# Patient Record
Sex: Male | Born: 1950 | Race: White | Hispanic: No | Marital: Married | Smoking: Never smoker
Health system: Southern US, Community
[De-identification: ages and names within clinical notes are randomized; demographics above are authoritative.]

## PROBLEM LIST (undated history)

## (undated) DIAGNOSIS — I4891 Unspecified atrial fibrillation: Secondary | ICD-10-CM

## (undated) DIAGNOSIS — K219 Gastro-esophageal reflux disease without esophagitis: Secondary | ICD-10-CM

## (undated) DIAGNOSIS — I341 Nonrheumatic mitral (valve) prolapse: Secondary | ICD-10-CM

## (undated) HISTORY — DX: Nonrheumatic mitral (valve) prolapse: I34.1

## (undated) HISTORY — DX: Gastro-esophageal reflux disease without esophagitis: K21.9

## (undated) HISTORY — DX: Unspecified atrial fibrillation: I48.91

---

## 2013-10-31 ENCOUNTER — Ambulatory Visit (INDEPENDENT_AMBULATORY_CARE_PROVIDER_SITE_OTHER): Payer: BC Managed Care – PPO | Admitting: *Deleted

## 2013-10-31 DIAGNOSIS — Z5181 Encounter for therapeutic drug level monitoring: Secondary | ICD-10-CM | POA: Insufficient documentation

## 2013-10-31 DIAGNOSIS — I4891 Unspecified atrial fibrillation: Secondary | ICD-10-CM

## 2013-10-31 LAB — POCT INR: INR: 3

## 2013-11-14 ENCOUNTER — Ambulatory Visit (INDEPENDENT_AMBULATORY_CARE_PROVIDER_SITE_OTHER): Payer: BC Managed Care – PPO | Admitting: Cardiovascular Disease

## 2013-11-14 VITALS — BP 117/81 | HR 79 | Ht 76.0 in | Wt 200.0 lb

## 2013-11-14 DIAGNOSIS — Z5181 Encounter for therapeutic drug level monitoring: Secondary | ICD-10-CM

## 2013-11-14 DIAGNOSIS — I4891 Unspecified atrial fibrillation: Secondary | ICD-10-CM

## 2013-11-14 DIAGNOSIS — Z136 Encounter for screening for cardiovascular disorders: Secondary | ICD-10-CM

## 2013-11-14 MED ORDER — WARFARIN SODIUM 5 MG PO TABS: 10.0000 mg | ORAL_TABLET | ORAL | Status: DC

## 2013-11-14 MED ORDER — METOPROLOL SUCCINATE ER 50 MG PO TB24: 50.0000 mg | ORAL_TABLET | Freq: Every day | ORAL | Status: DC

## 2013-11-14 MED ORDER — VERAPAMIL HCL 120 MG PO TABS: 120.0000 mg | ORAL_TABLET | Freq: Three times a day (TID) | ORAL | Status: DC

## 2013-11-14 MED ORDER — ALPRAZOLAM 0.25 MG PO TABS: 0.2500 mg | ORAL_TABLET | Freq: Four times a day (QID) | ORAL | Status: DC | PRN

## 2013-11-14 NOTE — Patient Instructions (Signed)
Continue all current medications. Your physician wants you to follow up in: 6 months.  You will receive a reminder letter in the mail one-two months in advance.  If you don't receive a letter, please call our office to schedule the follow up appointment   

## 2013-11-14 NOTE — Progress Notes (Signed)
Patient ID: Christopher Blanchard, male   DOB: Jun 21, 1951, 63 y.o.   MRN: [id number]       CARDIOLOGY CONSULT NOTE  Patient ID: Christopher Blanchard MRN: [id number] DOB/AGE: 30-Jul-1951 63 y.o.  Admit date: (Not on file) Primary Physician No primary provider on file.  Reason for Consultation: atrial fibrillation  HPI: The patient is a 63 year old male who recently moved here from [place] in 06/2013. He is a retired Personnel officer. He and his wife moved here to be with their daughter and grandchildren. He has a history of atrial fibrillation. He takes warfarin for anticoagulation and takes metoprolol and verapamil for rate control. His 12-lead ECG today shows atrial fibrillation, heart rate 64 beats per minute. He thinks he was diagnosed approximately 5 years ago. He denies a history of myocardial infarction.The patient denies any symptoms of chest pain, shortness of breath, lightheadedness, dizziness, leg swelling, orthopnea, PND, and syncope. He has a history of anxiety and rarely takes Xanax for this. Although he seldom experiences palpitations, he does become anxious when this occurs.     Allergies not on file  Current Outpatient Prescriptions  Medication Sig Dispense Refill  . ALPRAZolam (XANAX) 0.5 MG tablet Take 0.25 mg by mouth every 6 (six) hours as needed for anxiety.      . cephALEXin (KEFLEX) 500 MG capsule Take 2,000 mg by mouth as needed. Prior to dental  visits      . metoprolol succinate (TOPROL-XL) 50 MG 24 hr tablet Take 50 mg by mouth daily. Take with or immediately following a meal.      . verapamil (CALAN) 120 MG tablet Take 120 mg by mouth 3 (three) times daily.      Marland Kitchen warfarin (COUMADIN) 5 MG tablet Take 10 mg by mouth as directed.       No current facility-administered medications for this visit.    No past medical history on file.  No past surgical history on file.  History   Social History  . Marital Status: Married    Spouse Name: N/A    Number of Children: N/A    . Years of Education: N/A   Occupational History  . Not on file.   Social History Main Topics  . Smoking status: Not on file  . Smokeless tobacco: Not on file  . Alcohol Use: Not on file  . Drug Use: Not on file  . Sexual Activity: Not on file   Other Topics Concern  . Not on file   Social History Narrative  . No narrative on file     No family history of premature CAD in 1st degree relatives.  Prior to Admission medications   Medication Sig Start Date End Date Taking? Authorizing Provider  ALPRAZolam Prudy Feeler) 0.5 MG tablet Take 0.25 mg by mouth every 6 (six) hours as needed for anxiety.   Yes Historical Provider, MD  cephALEXin (KEFLEX) 500 MG capsule Take 2,000 mg by mouth as needed. Prior to dental  visits   Yes Historical Provider, MD  metoprolol succinate (TOPROL-XL) 50 MG 24 hr tablet Take 50 mg by mouth daily. Take with or immediately following a meal.   Yes Historical Provider, MD  verapamil (CALAN) 120 MG tablet Take 120 mg by mouth 3 (three) times daily.   Yes Historical Provider, MD  warfarin (COUMADIN) 5 MG tablet Take 10 mg by mouth as directed.   Yes Historical Provider, MD     Review of systems complete and found to be negative unless listed  above in HPI     Physical exam Blood pressure 117/81, pulse 79, height 6\' 4"  (1.93 m), weight 200 lb (90.719 kg). General: NAD Neck: No JVD, no thyromegaly or thyroid nodule.  Lungs: Clear to auscultation bilaterally with normal respiratory effort. CV: Nondisplaced PMI.  Heart irregular rhythm, normal S1/S2, no S3, no murmur.  No peripheral edema.  No carotid bruit.  Normal pedal pulses.  Abdomen: Soft, nontender, no hepatosplenomegaly, no distention.  Skin: Intact without lesions or rashes.  Neurologic: Alert and oriented x 3.  Psych: Normal affect. Extremities: No clubbing or cyanosis.  HEENT: Normal.   Labs:   No results found for this basename: WBC, HGB, HCT, MCV, PLT   No results found for this basename:  NA, K, CL, CO2, BUN, CREATININE, CALCIUM, LABALBU, PROT, BILITOT, ALKPHOS, ALT, AST, GLUCOSE,  in the last 168 hours No results found for this basename: CKTOTAL, CKMB, CKMBINDEX, TROPONINI    No results found for this basename: CHOL   No results found for this basename: HDL   No results found for this basename: LDLCALC   No results found for this basename: TRIG   No results found for this basename: CHOLHDL   No results found for this basename: LDLDIRECT         Studies: No results found.  ASSESSMENT AND PLAN: 1. Atrial fibrillation: His heart rate is controlled on both metoprolol and verapamil. He is asymptomatic. He takes warfarin for anticoagulation and is enrolled in our clinic. I did offer him one of the new target specific oral anticoagulants, but he is not interested at this time. 2. Anxiety: Although he seldom experiences palpitations, he does become anxious when this occurs. I will give him a 30 day supply of Xanax of 0.25 mg tablets to be used as needed for anxiety with one refill.  Dispo: f/u 6 months.  Signed: Prentice DockerSuresh Koneswaran, M.D., F.A.C.C.  11/14/2013, 9:32 AM

## 2013-11-28 ENCOUNTER — Ambulatory Visit (INDEPENDENT_AMBULATORY_CARE_PROVIDER_SITE_OTHER): Payer: BC Managed Care – PPO | Admitting: *Deleted

## 2013-11-28 DIAGNOSIS — I4891 Unspecified atrial fibrillation: Secondary | ICD-10-CM

## 2013-11-28 DIAGNOSIS — Z5181 Encounter for therapeutic drug level monitoring: Secondary | ICD-10-CM

## 2013-11-28 LAB — POCT INR: INR: 3.3

## 2013-12-26 ENCOUNTER — Ambulatory Visit (INDEPENDENT_AMBULATORY_CARE_PROVIDER_SITE_OTHER): Payer: BC Managed Care – PPO | Admitting: *Deleted

## 2013-12-26 DIAGNOSIS — Z5181 Encounter for therapeutic drug level monitoring: Secondary | ICD-10-CM

## 2013-12-26 DIAGNOSIS — I4891 Unspecified atrial fibrillation: Secondary | ICD-10-CM

## 2013-12-26 LAB — POCT INR: INR: 3.4

## 2014-01-23 ENCOUNTER — Ambulatory Visit (INDEPENDENT_AMBULATORY_CARE_PROVIDER_SITE_OTHER): Payer: BC Managed Care – PPO | Admitting: *Deleted

## 2014-01-23 DIAGNOSIS — Z5181 Encounter for therapeutic drug level monitoring: Secondary | ICD-10-CM

## 2014-01-23 DIAGNOSIS — I4891 Unspecified atrial fibrillation: Secondary | ICD-10-CM

## 2014-01-23 LAB — POCT INR: INR: 2.7

## 2014-02-20 ENCOUNTER — Ambulatory Visit (INDEPENDENT_AMBULATORY_CARE_PROVIDER_SITE_OTHER): Payer: BC Managed Care – PPO | Admitting: *Deleted

## 2014-02-20 DIAGNOSIS — I4891 Unspecified atrial fibrillation: Secondary | ICD-10-CM

## 2014-02-20 DIAGNOSIS — Z5181 Encounter for therapeutic drug level monitoring: Secondary | ICD-10-CM

## 2014-02-20 LAB — POCT INR: INR: 3

## 2014-03-20 ENCOUNTER — Ambulatory Visit (INDEPENDENT_AMBULATORY_CARE_PROVIDER_SITE_OTHER): Payer: BC Managed Care – PPO | Admitting: *Deleted

## 2014-03-20 DIAGNOSIS — Z5181 Encounter for therapeutic drug level monitoring: Secondary | ICD-10-CM

## 2014-03-20 DIAGNOSIS — I4891 Unspecified atrial fibrillation: Secondary | ICD-10-CM

## 2014-03-20 LAB — POCT INR: INR: 2.6

## 2014-04-24 ENCOUNTER — Ambulatory Visit (INDEPENDENT_AMBULATORY_CARE_PROVIDER_SITE_OTHER): Payer: BC Managed Care – PPO | Admitting: *Deleted

## 2014-04-24 DIAGNOSIS — I4891 Unspecified atrial fibrillation: Secondary | ICD-10-CM

## 2014-04-24 DIAGNOSIS — Z5181 Encounter for therapeutic drug level monitoring: Secondary | ICD-10-CM

## 2014-04-24 LAB — POCT INR: INR: 2.5

## 2014-05-31 ENCOUNTER — Telehealth: Payer: Self-pay | Admitting: *Deleted

## 2014-05-31 ENCOUNTER — Other Ambulatory Visit: Payer: Self-pay | Admitting: *Deleted

## 2014-05-31 MED ORDER — WARFARIN SODIUM 5 MG PO TABS: 10.0000 mg | ORAL_TABLET | ORAL | Status: DC

## 2014-05-31 MED ORDER — METOPROLOL SUCCINATE ER 50 MG PO TB24: 50.0000 mg | ORAL_TABLET | Freq: Every day | ORAL | Status: DC

## 2014-05-31 MED ORDER — VERAPAMIL HCL 120 MG PO TABS: 120.0000 mg | ORAL_TABLET | Freq: Three times a day (TID) | ORAL | Status: DC

## 2014-05-31 NOTE — Telephone Encounter (Signed)
METOPROLOL, COUMADIN AND VERAPAMIL ALL SENT TO PHARMACY. 6 MO F/U SCHEDULED ALSO

## 2014-06-05 ENCOUNTER — Ambulatory Visit (INDEPENDENT_AMBULATORY_CARE_PROVIDER_SITE_OTHER): Payer: BC Managed Care – PPO | Admitting: Cardiovascular Disease

## 2014-06-05 ENCOUNTER — Ambulatory Visit (INDEPENDENT_AMBULATORY_CARE_PROVIDER_SITE_OTHER): Payer: BC Managed Care – PPO | Admitting: *Deleted

## 2014-06-05 ENCOUNTER — Encounter: Payer: Self-pay | Admitting: Cardiovascular Disease

## 2014-06-05 VITALS — BP 112/76 | HR 64 | Ht 76.0 in | Wt 209.0 lb

## 2014-06-05 DIAGNOSIS — Z5181 Encounter for therapeutic drug level monitoring: Secondary | ICD-10-CM

## 2014-06-05 DIAGNOSIS — I4891 Unspecified atrial fibrillation: Secondary | ICD-10-CM

## 2014-06-05 LAB — POCT INR: INR: 3

## 2014-06-05 NOTE — Patient Instructions (Signed)
Continue all current medications. Your physician wants you to follow up in:  1 year.  You will receive a reminder letter in the mail one-two months in advance.  If you don't receive a letter, please call our office to schedule the follow up appointment   

## 2014-06-05 NOTE — Progress Notes (Signed)
Patient ID: Christopher Blanchard, male   DOB: 09/12/1950, 63 y.o.   MRN: [id number]      SUBJECTIVE: The patient is here for routine followup for atrial fibrillation. He has been doing well and denies chest pain, palpitations, shortness of breath, leg swelling, and dizziness. He and his wife recently visited [place], and they have made two trips to [place] since I last saw them.   Review of Systems: As per "subjective", otherwise negative.  Allergies  Allergen Reactions  . Penicillins Palpitations    Current Outpatient Prescriptions  Medication Sig Dispense Refill  . ALPRAZolam (XANAX) 0.25 MG tablet Take 1 tablet (0.25 mg total) by mouth every 6 (six) hours as needed for anxiety.  30 tablet  1  . cephALEXin (KEFLEX) 500 MG capsule Take 2,000 mg by mouth as needed. Prior to dental  visits      . metoprolol succinate (TOPROL-XL) 50 MG 24 hr tablet Take 1 tablet (50 mg total) by mouth daily. Take with or immediately following a meal.  90 tablet  3  . verapamil (CALAN) 120 MG tablet Take 1 tablet (120 mg total) by mouth 3 (three) times daily.  270 tablet  3  . warfarin (COUMADIN) 5 MG tablet Take 2 tablets (10 mg total) by mouth as directed.  180 tablet  0   No current facility-administered medications for this visit.    No past medical history on file.  No past surgical history on file.  History   Social History  . Marital Status: Married    Spouse Name: N/A    Number of Children: N/A  . Years of Education: N/A   Occupational History  . Not on file.   Social History Main Topics  . Smoking status: Never Smoker   . Smokeless tobacco: Never Used  . Alcohol Use: Not on file  . Drug Use: Not on file  . Sexual Activity: Not on file   Other Topics Concern  . Not on file   Social History Narrative  . No narrative on file     Filed Vitals:   06/05/14 1511  Height: 6\' 4"  (1.93 m)  Weight: 209 lb (94.802 kg)   BP 112/76  Pulse 64   PHYSICAL EXAM General: NAD  Neck: No  JVD, no thyromegaly or thyroid nodule.  Lungs: Clear to auscultation bilaterally with normal respiratory effort.  CV: Nondisplaced PMI. Irregular rhythm, normal S1/S2, no S3, no murmur. No peripheral edema. No carotid bruit. Normal pedal pulses.  Abdomen: Soft, nontender, no hepatosplenomegaly, no distention.  Skin: Intact without lesions or rashes.  Neurologic: Alert and oriented x 3.  Psych: Normal affect. Skin: Normal. Musculoskeletal: Normal range of motion, no gross deformities. Extremities: No clubbing or cyanosis.   ECG: Most recent ECG reviewed.      ASSESSMENT AND PLAN: 1. Atrial fibrillation: His heart rate is controlled on both metoprolol and verapamil. He is asymptomatic. He takes warfarin for anticoagulation and is enrolled in our clinic. I previously offered him one of the new target specific oral anticoagulants, but he was not interested.  Dispo: f/u 1 year.  Prentice DockerSuresh Jennett Tarbell, M.D., F.A.C.C.

## 2014-07-17 ENCOUNTER — Ambulatory Visit (INDEPENDENT_AMBULATORY_CARE_PROVIDER_SITE_OTHER): Payer: BC Managed Care – PPO | Admitting: *Deleted

## 2014-07-17 DIAGNOSIS — Z5181 Encounter for therapeutic drug level monitoring: Secondary | ICD-10-CM

## 2014-07-17 DIAGNOSIS — I4891 Unspecified atrial fibrillation: Secondary | ICD-10-CM

## 2014-07-17 LAB — POCT INR: INR: 3.2

## 2014-08-21 ENCOUNTER — Ambulatory Visit (INDEPENDENT_AMBULATORY_CARE_PROVIDER_SITE_OTHER): Payer: Federal, State, Local not specified - PPO | Admitting: *Deleted

## 2014-08-21 DIAGNOSIS — Z5181 Encounter for therapeutic drug level monitoring: Secondary | ICD-10-CM

## 2014-08-21 DIAGNOSIS — I4891 Unspecified atrial fibrillation: Secondary | ICD-10-CM

## 2014-08-21 LAB — POCT INR: INR: 2.9

## 2014-08-22 ENCOUNTER — Telehealth: Payer: Self-pay | Admitting: *Deleted

## 2014-08-22 NOTE — Telephone Encounter (Signed)
Patient is not coming off coumadin for teeth extraction. Dentist is requesting INR 1 day prior to procedure Tuesday 08/28/14.

## 2014-08-23 ENCOUNTER — Telehealth: Payer: Self-pay | Admitting: *Deleted

## 2014-08-23 NOTE — Telephone Encounter (Signed)
Pt is having 2 teeth extracted on 1/12 by [facility] in [place].  They told him it was ok for him to stay on his coumadin as long as his INR was in the 2.0 - 3.0 range.  He is scheduled to come to the office on 08/27/14 for an INR check per surgeons request.

## 2014-08-23 NOTE — Telephone Encounter (Signed)
Mr. Lovell SheehanJenkins is having a dental procedure requesting to speak with Misty StanleyLisa. [phone]

## 2014-09-18 ENCOUNTER — Ambulatory Visit (INDEPENDENT_AMBULATORY_CARE_PROVIDER_SITE_OTHER): Payer: Federal, State, Local not specified - PPO | Admitting: *Deleted

## 2014-09-18 DIAGNOSIS — I4891 Unspecified atrial fibrillation: Secondary | ICD-10-CM

## 2014-09-18 DIAGNOSIS — Z5181 Encounter for therapeutic drug level monitoring: Secondary | ICD-10-CM

## 2014-09-18 LAB — POCT INR: INR: 2.8

## 2014-10-16 ENCOUNTER — Ambulatory Visit (INDEPENDENT_AMBULATORY_CARE_PROVIDER_SITE_OTHER): Payer: Federal, State, Local not specified - PPO | Admitting: *Deleted

## 2014-10-16 DIAGNOSIS — I4891 Unspecified atrial fibrillation: Secondary | ICD-10-CM

## 2014-10-16 DIAGNOSIS — Z5181 Encounter for therapeutic drug level monitoring: Secondary | ICD-10-CM

## 2014-10-16 LAB — POCT INR: INR: 3.1

## 2014-11-13 ENCOUNTER — Ambulatory Visit (INDEPENDENT_AMBULATORY_CARE_PROVIDER_SITE_OTHER): Payer: Federal, State, Local not specified - PPO | Admitting: *Deleted

## 2014-11-13 DIAGNOSIS — I4891 Unspecified atrial fibrillation: Secondary | ICD-10-CM

## 2014-11-13 DIAGNOSIS — Z5181 Encounter for therapeutic drug level monitoring: Secondary | ICD-10-CM

## 2014-11-13 LAB — POCT INR: INR: 2.3

## 2014-11-16 ENCOUNTER — Other Ambulatory Visit: Payer: Self-pay | Admitting: *Deleted

## 2014-11-16 MED ORDER — WARFARIN SODIUM 5 MG PO TABS: 7.5000 mg | ORAL_TABLET | Freq: Every day | ORAL | Status: DC

## 2014-12-18 ENCOUNTER — Ambulatory Visit (INDEPENDENT_AMBULATORY_CARE_PROVIDER_SITE_OTHER): Payer: BLUE CROSS/BLUE SHIELD | Admitting: *Deleted

## 2014-12-18 DIAGNOSIS — I4891 Unspecified atrial fibrillation: Secondary | ICD-10-CM

## 2014-12-18 DIAGNOSIS — Z5181 Encounter for therapeutic drug level monitoring: Secondary | ICD-10-CM

## 2014-12-18 LAB — POCT INR: INR: 2

## 2015-01-29 ENCOUNTER — Ambulatory Visit (INDEPENDENT_AMBULATORY_CARE_PROVIDER_SITE_OTHER): Payer: BLUE CROSS/BLUE SHIELD | Admitting: *Deleted

## 2015-01-29 DIAGNOSIS — Z5181 Encounter for therapeutic drug level monitoring: Secondary | ICD-10-CM

## 2015-01-29 DIAGNOSIS — I4891 Unspecified atrial fibrillation: Secondary | ICD-10-CM | POA: Diagnosis not present

## 2015-01-29 LAB — POCT INR: INR: 3

## 2015-02-20 ENCOUNTER — Other Ambulatory Visit: Payer: Self-pay | Admitting: Cardiovascular Disease

## 2015-02-20 MED ORDER — WARFARIN SODIUM 5 MG PO TABS: 7.5000 mg | ORAL_TABLET | Freq: Every day | ORAL | Status: DC

## 2015-02-20 NOTE — Telephone Encounter (Signed)
Needs refill warfarin (COUMADIN) 5 MG tablet

## 2015-02-20 NOTE — Telephone Encounter (Signed)
Done

## 2015-03-12 ENCOUNTER — Ambulatory Visit (INDEPENDENT_AMBULATORY_CARE_PROVIDER_SITE_OTHER): Payer: BLUE CROSS/BLUE SHIELD | Admitting: *Deleted

## 2015-03-12 DIAGNOSIS — Z5181 Encounter for therapeutic drug level monitoring: Secondary | ICD-10-CM

## 2015-03-12 DIAGNOSIS — I4891 Unspecified atrial fibrillation: Secondary | ICD-10-CM

## 2015-03-12 LAB — POCT INR: INR: 3

## 2015-04-05 ENCOUNTER — Ambulatory Visit (INDEPENDENT_AMBULATORY_CARE_PROVIDER_SITE_OTHER): Payer: BLUE CROSS/BLUE SHIELD | Admitting: Cardiovascular Disease

## 2015-04-05 ENCOUNTER — Encounter: Payer: Self-pay | Admitting: Cardiovascular Disease

## 2015-04-05 VITALS — BP 100/82 | HR 52 | Ht 76.0 in | Wt 209.0 lb

## 2015-04-05 DIAGNOSIS — J302 Other seasonal allergic rhinitis: Secondary | ICD-10-CM | POA: Diagnosis not present

## 2015-04-05 DIAGNOSIS — I482 Chronic atrial fibrillation, unspecified: Secondary | ICD-10-CM

## 2015-04-05 DIAGNOSIS — Z5181 Encounter for therapeutic drug level monitoring: Secondary | ICD-10-CM | POA: Diagnosis not present

## 2015-04-05 DIAGNOSIS — R5383 Other fatigue: Secondary | ICD-10-CM | POA: Diagnosis not present

## 2015-04-05 MED ORDER — VERAPAMIL HCL 120 MG PO TABS: 120.0000 mg | ORAL_TABLET | Freq: Two times a day (BID) | ORAL | Status: DC

## 2015-04-05 NOTE — Patient Instructions (Signed)
   Decrease Verapamil to twice a day   Continue all other medications.   Your physician wants you to follow up in:  1 year.  You will receive a reminder letter in the mail one-two months in advance.  If you don't receive a letter, please call our office to schedule the follow up appointment

## 2015-04-05 NOTE — Progress Notes (Signed)
Patient ID: Christopher Blanchard, male   DOB: 11-15-1950, 64 y.o.   MRN: [id number]      SUBJECTIVE: The patient is here for routine followup for chronic atrial fibrillation. He denies chest pain, palpitations, lightheadedness, dizziness, leg swelling. He has been feeling fatigued. He only sleeps 2 hours per night. He takes a half tablet of Xanax as needed. He also struggles with seasonal allergies.   Review of Systems: As per "subjective", otherwise negative.  Allergies  Allergen Reactions  . Penicillins Palpitations    Current Outpatient Prescriptions  Medication Sig Dispense Refill  . ALPRAZolam (XANAX) 0.25 MG tablet Take 1 tablet (0.25 mg total) by mouth every 6 (six) hours as needed for anxiety. 30 tablet 1  . cephALEXin (KEFLEX) 500 MG capsule Take 2,000 mg by mouth as needed. Prior to dental  visits    . metoprolol succinate (TOPROL-XL) 50 MG 24 hr tablet Take 1 tablet (50 mg total) by mouth daily. Take with or immediately following a meal. 90 tablet 3  . verapamil (CALAN) 120 MG tablet Take 1 tablet (120 mg total) by mouth 3 (three) times daily. 270 tablet 3  . warfarin (COUMADIN) 5 MG tablet Take 1.5 tablets (7.5 mg total) by mouth daily. 135 tablet 0   No current facility-administered medications for this visit.    Past Medical History  Diagnosis Date  . Mitral valve prolapse   . Atrial fibrillation     No past surgical history on file.  Social History   Social History  . Marital Status: Married    Spouse Name: N/A  . Number of Children: N/A  . Years of Education: N/A   Occupational History  . Not on file.   Social History Main Topics  . Smoking status: Never Smoker   . Smokeless tobacco: Never Used  . Alcohol Use: Not on file  . Drug Use: Not on file  . Sexual Activity: Not on file   Other Topics Concern  . Not on file   Social History Narrative     Filed Vitals:   04/05/15 1144  BP: 100/82  Pulse: 52  Height:  (1.93 m)  Weight: 209 lb  (94.802 kg)  SpO2: 97%    PHYSICAL EXAM General: NAD HEENT: Normal. Neck: No JVD, no thyromegaly. Lungs: Clear to auscultation bilaterally with normal respiratory effort. CV: Bradycardic, irregular rhythm, normal S1/S2, no S3, no murmur. No pretibial or periankle edema.  No carotid bruit.   Abdomen: Soft, nontender, no distention.  Neurologic: Alert and oriented x 3.  Psych: Normal affect. Skin: Normal. Musculoskeletal: Normal range of motion, no gross deformities. Extremities: No clubbing or cyanosis.   ECG: Most recent ECG reviewed.      ASSESSMENT AND PLAN: 1. Fatigue in setting of chronic atrial fibrillation: His heart rate is controlled on both metoprolol and verapamil. However, he has been more fatigued lately and is bradycardic. Will reduce verapamil to 120 mg bid. He takes warfarin for anticoagulation and is enrolled in our clinic. I previously offered him one of the new target specific oral anticoagulants, but he was not interested.  2. Seasonal allergies: Currently trying Singulair without relief. Recommended he speak to PCP about potential referral to immunologist.  Dispo: f/u 1 year.   Prentice Docker, M.D., F.A.C.C.

## 2015-04-08 ENCOUNTER — Telehealth: Payer: Self-pay | Admitting: *Deleted

## 2015-04-08 MED ORDER — ALPRAZOLAM 0.5 MG PO TABS: 0.5000 mg | ORAL_TABLET | Freq: Every day | ORAL | Status: AC

## 2015-04-08 NOTE — Telephone Encounter (Signed)
Ok to refill prescription. Would advise getting additional refills from PCP afterwards.

## 2015-04-08 NOTE — Telephone Encounter (Signed)
Wife Lupita Leash) notified to pick up prescription.  Verbalized understanding of future refills from PMD.

## 2015-04-08 NOTE — Telephone Encounter (Signed)
Patient initially given Xanax March 2015 by you.  In for office visit on 04/05/2015.  He questions getting another prescription for Xanax.  Stated you told him to try doing 0.5mg  at bedtime as needed for sleep.

## 2015-04-23 ENCOUNTER — Ambulatory Visit (INDEPENDENT_AMBULATORY_CARE_PROVIDER_SITE_OTHER): Payer: BLUE CROSS/BLUE SHIELD | Admitting: *Deleted

## 2015-04-23 DIAGNOSIS — I4891 Unspecified atrial fibrillation: Secondary | ICD-10-CM

## 2015-04-23 DIAGNOSIS — Z5181 Encounter for therapeutic drug level monitoring: Secondary | ICD-10-CM | POA: Diagnosis not present

## 2015-04-23 LAB — POCT INR: INR: 3.1

## 2015-05-21 ENCOUNTER — Ambulatory Visit (INDEPENDENT_AMBULATORY_CARE_PROVIDER_SITE_OTHER): Payer: BLUE CROSS/BLUE SHIELD | Admitting: *Deleted

## 2015-05-21 DIAGNOSIS — Z5181 Encounter for therapeutic drug level monitoring: Secondary | ICD-10-CM

## 2015-05-21 DIAGNOSIS — I4891 Unspecified atrial fibrillation: Secondary | ICD-10-CM | POA: Diagnosis not present

## 2015-05-21 LAB — POCT INR: INR: 2.9

## 2015-06-18 ENCOUNTER — Ambulatory Visit (INDEPENDENT_AMBULATORY_CARE_PROVIDER_SITE_OTHER): Payer: BLUE CROSS/BLUE SHIELD | Admitting: *Deleted

## 2015-06-18 DIAGNOSIS — Z5181 Encounter for therapeutic drug level monitoring: Secondary | ICD-10-CM | POA: Diagnosis not present

## 2015-06-18 DIAGNOSIS — I4891 Unspecified atrial fibrillation: Secondary | ICD-10-CM

## 2015-06-18 LAB — POCT INR: INR: 2.4

## 2015-07-01 ENCOUNTER — Other Ambulatory Visit: Payer: Self-pay | Admitting: *Deleted

## 2015-07-01 MED ORDER — METOPROLOL SUCCINATE ER 50 MG PO TB24: 50.0000 mg | ORAL_TABLET | Freq: Every day | ORAL | Status: DC

## 2015-07-01 MED ORDER — VERAPAMIL HCL 120 MG PO TABS: 120.0000 mg | ORAL_TABLET | Freq: Two times a day (BID) | ORAL | Status: DC

## 2015-07-01 MED ORDER — WARFARIN SODIUM 5 MG PO TABS: 7.5000 mg | ORAL_TABLET | Freq: Every day | ORAL | Status: DC

## 2015-07-30 ENCOUNTER — Ambulatory Visit (INDEPENDENT_AMBULATORY_CARE_PROVIDER_SITE_OTHER): Payer: BLUE CROSS/BLUE SHIELD | Admitting: *Deleted

## 2015-07-30 DIAGNOSIS — Z5181 Encounter for therapeutic drug level monitoring: Secondary | ICD-10-CM | POA: Diagnosis not present

## 2015-07-30 DIAGNOSIS — I4891 Unspecified atrial fibrillation: Secondary | ICD-10-CM | POA: Diagnosis not present

## 2015-07-30 LAB — POCT INR: INR: 2

## 2015-09-10 ENCOUNTER — Ambulatory Visit (INDEPENDENT_AMBULATORY_CARE_PROVIDER_SITE_OTHER): Payer: BLUE CROSS/BLUE SHIELD | Admitting: *Deleted

## 2015-09-10 DIAGNOSIS — Z5181 Encounter for therapeutic drug level monitoring: Secondary | ICD-10-CM

## 2015-09-10 DIAGNOSIS — I4891 Unspecified atrial fibrillation: Secondary | ICD-10-CM | POA: Diagnosis not present

## 2015-09-10 LAB — POCT INR: INR: 1.9

## 2015-10-08 ENCOUNTER — Ambulatory Visit (INDEPENDENT_AMBULATORY_CARE_PROVIDER_SITE_OTHER): Payer: BLUE CROSS/BLUE SHIELD | Admitting: *Deleted

## 2015-10-08 DIAGNOSIS — Z5181 Encounter for therapeutic drug level monitoring: Secondary | ICD-10-CM

## 2015-10-08 DIAGNOSIS — I4891 Unspecified atrial fibrillation: Secondary | ICD-10-CM | POA: Diagnosis not present

## 2015-10-08 LAB — POCT INR: INR: 2.4

## 2015-10-09 ENCOUNTER — Other Ambulatory Visit (HOSPITAL_COMMUNITY): Payer: Self-pay | Admitting: Neurology

## 2015-10-09 ENCOUNTER — Encounter: Payer: Self-pay | Admitting: Neurology

## 2015-10-09 ENCOUNTER — Other Ambulatory Visit: Payer: BLUE CROSS/BLUE SHIELD

## 2015-10-09 ENCOUNTER — Ambulatory Visit (INDEPENDENT_AMBULATORY_CARE_PROVIDER_SITE_OTHER): Payer: BLUE CROSS/BLUE SHIELD | Admitting: Neurology

## 2015-10-09 VITALS — BP 126/80 | HR 77 | Ht 77.0 in | Wt 207.0 lb

## 2015-10-09 DIAGNOSIS — R253 Fasciculation: Secondary | ICD-10-CM

## 2015-10-09 DIAGNOSIS — R531 Weakness: Secondary | ICD-10-CM

## 2015-10-09 DIAGNOSIS — F458 Other somatoform disorders: Secondary | ICD-10-CM

## 2015-10-09 DIAGNOSIS — G231 Progressive supranuclear ophthalmoplegia [Steele-Richardson-Olszewski]: Secondary | ICD-10-CM | POA: Diagnosis not present

## 2015-10-09 DIAGNOSIS — R1319 Other dysphagia: Secondary | ICD-10-CM

## 2015-10-09 DIAGNOSIS — F482 Pseudobulbar affect: Secondary | ICD-10-CM

## 2015-10-09 DIAGNOSIS — R1310 Dysphagia, unspecified: Secondary | ICD-10-CM

## 2015-10-09 MED ORDER — CARBIDOPA-LEVODOPA 25-100 MG PO TABS: 1.0000 | ORAL_TABLET | Freq: Three times a day (TID) | ORAL | Status: AC

## 2015-10-09 NOTE — Patient Instructions (Addendum)
1.  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   2.  You may want to look on the cure PSP website 3. Your provider has requested that you have labwork completed today. Please go to [facility] ([id number]) on the second floor of this building before leaving the office today. You do not need to check in. If you are not called within 15 minutes please check with the front desk.  4. We have scheduled you at [facility] for your OPEN MRI on 10/21/15 at 12:30. Please arrive 30 minutes prior and go to [address] [facility]. If you need to change this appt please call [phone]. 5. Start Carbidopa Levodopa as follows: 1/2 tab three times a day before meals x 1 wk, then 1/2 in am & noon & 1 in evening for a week, then 1/2 in am &1 at noon &one in evening for a week, then 1 tablet three times a day before meals. 6. We have scheduled you at [facility] for your modified barium swallow on 10/29/15 at 1:00 pm. Please arrive 15 minutes prior and go to 1st floor radiology. If you need to reschedule for any reason please call [phone]. 7. We will call you appt for your EMG.

## 2015-10-09 NOTE — Progress Notes (Signed)
TARRENCE Blanchard was seen today in the movement disorders clinic for neurologic consultation at the request of [name] at [facility].  His PCP is [name], MD.  The consultation is for the evaluation of weak voice and drooling.  He is accompanied by his wife who supplements the history.  The patient states that they moved from [place] 2 years ago and he noted voice change less than a year after that.  It was attributed to carpet in the new house after they went to see the ENT.  They pulled the carpet out of the house and he thinks the voice is a little better, but when he went back last time, the ENT thought that he had features of PD.  He did have nodules on the vocal cord.     Specific Symptoms:  Tremor: Yes.  , when he shaves, he will note R hand tremor.  Never notes tremor in the lap Voice: become more hoarse and intermittently will lose voice  Sleep: trouble staying asleep  Vivid Dreams:  No.  Acting out dreams:  No. Wet Pillows: Yes.   (occasional) Postural symptoms:  Yes.   (states that he is less balanced but blames on being less active over last 2 years)  Falls?  No. Bradykinesia symptoms: slow movements, difficulty getting out of a chair and shorter stride length per wife Loss of smell:  No. Loss of taste:  No. Urinary Incontinence:  No. Difficulty Swallowing:  No. Handwriting, micrographia: Yes.   Trouble with ADL's:  No.  Trouble buttoning clothing: No. Depression:  No. per pt but yes per wife; more agitated per wife Memory changes:  No. Hallucinations:  No.  visual distortions: No. N/V:  No. Lightheaded:  No.  Syncope: No. Diplopia:  No. Dyskinesia:  No.  Neuroimaging has not previously been performed.     ALLERGIES:   Allergies  Allergen Reactions  . Penicillins Palpitations    CURRENT MEDICATIONS:  Outpatient Encounter Prescriptions as of 10/09/2015  Medication Sig  . ALPRAZolam (XANAX) 0.5 MG tablet Take 1 tablet (0.5 mg total) by mouth at  bedtime. As needed for sleep.  (FUTURE REFILLS FROM PMD)  . metoprolol succinate (TOPROL-XL) 50 MG 24 hr tablet Take 1 tablet (50 mg total) by mouth daily. Take with or immediately following a meal.  . pantoprazole (PROTONIX) 40 MG tablet Take 40 mg by mouth daily.  . verapamil (CALAN) 120 MG tablet Take 1 tablet (120 mg total) by mouth 2 (two) times daily.  Marland Kitchen warfarin (COUMADIN) 5 MG tablet Take 1.5 tablets (7.5 mg total) by mouth daily.  . cephALEXin (KEFLEX) 500 MG capsule Take 2,000 mg by mouth as needed. Reported on 10/09/2015   No facility-administered encounter medications on file as of 10/09/2015.    PAST MEDICAL HISTORY:   Past Medical History  Diagnosis Date  . Mitral valve prolapse   . Atrial fibrillation (HCC)   . GERD (gastroesophageal reflux disease)     PAST SURGICAL HISTORY:  No past surgical history on file.  SOCIAL HISTORY:   Social History   Social History  . Marital Status: Married    Spouse Name: N/A  . Number of Children: N/A  . Years of Education: N/A   Occupational History  . retired     Personnel officer   Social History Main Topics  . Smoking status: Never Smoker   . Smokeless tobacco: Never Used  . Alcohol Use: No  . Drug Use: No  . Sexual  Activity: Not on file   Other Topics Concern  . Not on file   Social History Narrative    FAMILY HISTORY:   Family Status  Relation Status Death Age  . Mother Deceased     DM, stroke  . Father Deceased     old age  . Sister Alive     DM  . Brother Alive     healthy  . Sister Alive     DM  . Child Alive     1, healthy    ROS:  A complete 10 system review of systems was obtained and was unremarkable apart from what is mentioned above.  PHYSICAL EXAMINATION:    VITALS:   Filed Vitals:   10/09/15 0951  BP: 126/80  Pulse: 77  Height: 6\' 5"  (1.956 m)  Weight: 207 lb (93.895 kg)   The patient was undressed and placed into examining shorts for the examination.  GEN:  The patient appears stated  age and is in NAD. HEENT:  Normocephalic, atraumatic.  The mucous membranes are moist. The superficial temporal arteries are without ropiness or tenderness.  No tongue fasciculations.   CV:  RRR Lungs:  CTAB Neck/HEME:  There are no carotid bruits bilaterally.  Neurological examination:  Orientation: The patient is alert and oriented x3. Fund of knowledge is appropriate.  Recent and remote memory are intact.  Attention and concentration are normal.    Able to name objects and repeat phrases. Cranial nerves: There is good facial symmetry. There is significant facial hypomimia.  Pupils are equal round and reactive to light bilaterally. Fundoscopic exam reveals clear margins bilaterally. Extraocular muscles are intact. The visual fields are full to confrontational testing. The speech is fluent and clear but significant pseudobulbar speech and pseudobulbar laughter. Unable to perform gutteral sounds.  Soft palate rises symmetrically and there is no tongue deviation. Hearing is intact to conversational tone. Sensation: Sensation is intact to light and pinprick throughout (facial, trunk, extremities). Vibration is intact at the bilateral big toe. There is no extinction with double simultaneous stimulation. There is no sensory dermatomal level identified. Motor: Strength is 5/5 in the bilateral upper and lower extremities.   Shoulder shrug is equal and symmetric.  There is no pronator drift.  Fasciculations are only noted in the gastrocnemius muscles bilaterally. Deep tendon reflexes: Deep tendon reflexes are 2/4 at the bilateral biceps, triceps, brachioradialis, patella and achilles. Plantar responses are downgoing bilaterally.  Movement examination: Tone: There is mild to moderate rigidity in the right upper extremity.  Tone elsewhere is normal. Abnormal movements: There is no resting tremor, even with distraction procedures.  Rarely, the tremor is noted in the right hand with posture. Coordination:   There is decremation with RAM's, seen with finger taps, heel taps, toe taps and alternation of supination/pronation of the forearm bilaterally. Gait and Station: The patient has no difficulty arising out of a deep-seated chair without the use of the hands. The patient's stride length is normal, with normal stride length.  The patient has a positive pull test.      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  Much greater than 50% of this 80 minute visit was spent in counseling with the patient and his wife.  We discussed diagnosis, as well as pathophysiology and prognosis.  We also talked about the fact that the atypical parkinsonian states can be difficult to diagnose, particularly early on, and sometimes the diagnosis will  change within the first few years.  However, his presentation is fairly classic.  Nonetheless, when patients present with PSP, particularly with bulbar involvement, it is important to rule out other diseases, particularly motor neuron disease.  I do not think he has this, but we will go ahead and proceed with EMG just to make sure.  -He will have an MRI of the brain.  -He will have lab work to include a chemistry, TSH, B12, folate, RPR, SPEP/UPEP with immunofixation.  -He will have a modified barium swallow, if for nothing else than for baseline.  -We will initiate low-dose levodopa.  I did discuss this with the patient and his wife that this may not be helpful and we may need to push the dose higher.  I did discuss with the patient and his wife that this medication generally is not as helpful in the atypical parkinsonian states as it is in Parkinson's disease, but nonetheless we try it to look for efficacy.  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 2.  I will see him back in 4-6 weeks, as I plan to adjust his levodopa  dosing then.

## 2015-10-10 LAB — TSH: TSH: 1.54 m[IU]/L (ref 0.40–4.50)

## 2015-10-10 LAB — FOLATE: FOLATE: 21.3 ng/mL (ref >5.4)

## 2015-10-10 LAB — RPR

## 2015-10-10 LAB — VITAMIN B12: VITAMIN B 12: 570 pg/mL (ref 200–1100)

## 2015-10-11 ENCOUNTER — Telehealth: Payer: Self-pay | Admitting: Neurology

## 2015-10-11 LAB — IMMUNOFIXATION ELECTROPHORESIS
IgA: 206 mg/dL (ref 68–379)
IgG (Immunoglobin G), Serum: 773 mg/dL (ref 650–1600)
IgM, Serum: 78 mg/dL (ref 41–251)

## 2015-10-11 LAB — PROTEIN ELECTROPHORESIS, SERUM
ALPHA-1-GLOBULIN: 0.3 g/dL (ref 0.2–0.3)
ALPHA-2-GLOBULIN: 0.5 g/dL (ref 0.5–0.9)
Albumin ELP: 5 g/dL — ABNORMAL HIGH (ref 3.8–4.8)
BETA GLOBULIN: 0.4 g/dL (ref 0.4–0.6)
Beta 2: 0.3 g/dL (ref 0.2–0.5)
GAMMA GLOBULIN: 0.8 g/dL (ref 0.8–1.7)
Total Protein, Serum Electrophoresis: 7.2 g/dL (ref 6.1–8.1)

## 2015-10-11 LAB — IMMUNOFIXATION INTE: Interpretation: 0

## 2015-10-11 NOTE — Telephone Encounter (Signed)
Pt does not want to do the EMG test.

## 2015-10-11 NOTE — Telephone Encounter (Signed)
I did not call patient. He was to be scheduled for EMG- did someone call him for this?

## 2015-10-11 NOTE — Telephone Encounter (Signed)
PT called and said he had a missed call from here and didn't know why he got a call/Dawn   CB# [phone]

## 2015-10-21 ENCOUNTER — Ambulatory Visit
Admission: RE | Admit: 2015-10-21 | Discharge: 2015-10-21 | Disposition: A | Discharge status: Home / Self Care | Payer: BLUE CROSS/BLUE SHIELD | Source: Ambulatory Visit | Attending: Neurology | Admitting: Neurology

## 2015-10-21 ENCOUNTER — Other Ambulatory Visit: Payer: Self-pay | Admitting: Neurology

## 2015-10-21 ENCOUNTER — Telehealth: Payer: Self-pay | Admitting: Neurology

## 2015-10-21 DIAGNOSIS — Z189 Retained foreign body fragments, unspecified material: Principal | ICD-10-CM

## 2015-10-21 DIAGNOSIS — H0553 Retained (old) foreign body following penetrating wound of bilateral orbits: Secondary | ICD-10-CM

## 2015-10-21 DIAGNOSIS — R253 Fasciculation: Secondary | ICD-10-CM

## 2015-10-21 DIAGNOSIS — R531 Weakness: Secondary | ICD-10-CM

## 2015-10-21 DIAGNOSIS — G231 Progressive supranuclear ophthalmoplegia [Steele-Richardson-Olszewski]: Secondary | ICD-10-CM

## 2015-10-21 NOTE — Telephone Encounter (Signed)
Patient made aware MR okay.  

## 2015-10-21 NOTE — Telephone Encounter (Signed)
-----   Message from Octaviano Battyebecca S Tat, DO sent at 10/21/2015  3:41 PM EST ----- Reviewed.  Mild-mod number scattered T2 hyperintensities.  Lesly RubensteinJade, you can let pt know that MRI looked okay and nothing to account for current sx's

## 2015-10-28 ENCOUNTER — Other Ambulatory Visit: Payer: Self-pay | Admitting: *Deleted

## 2015-10-28 ENCOUNTER — Telehealth: Payer: Self-pay | Admitting: Cardiovascular Disease

## 2015-10-28 MED ORDER — WARFARIN SODIUM 5 MG PO TABS: 7.5000 mg | ORAL_TABLET | Freq: Every day | ORAL | Status: DC

## 2015-10-28 NOTE — Telephone Encounter (Signed)
Done

## 2015-10-28 NOTE — Telephone Encounter (Signed)
Needs warafin filled

## 2015-10-29 ENCOUNTER — Ambulatory Visit (HOSPITAL_COMMUNITY)
Admission: RE | Admit: 2015-10-29 | Discharge: 2015-10-29 | Disposition: A | Discharge status: Home / Self Care | Payer: BLUE CROSS/BLUE SHIELD | Source: Ambulatory Visit | Attending: Neurology | Admitting: Neurology

## 2015-10-29 DIAGNOSIS — R531 Weakness: Secondary | ICD-10-CM

## 2015-10-29 DIAGNOSIS — G231 Progressive supranuclear ophthalmoplegia [Steele-Richardson-Olszewski]: Secondary | ICD-10-CM

## 2015-10-29 DIAGNOSIS — R1310 Dysphagia, unspecified: Secondary | ICD-10-CM | POA: Diagnosis not present

## 2015-10-29 DIAGNOSIS — R1319 Other dysphagia: Secondary | ICD-10-CM

## 2015-10-29 DIAGNOSIS — R253 Fasciculation: Secondary | ICD-10-CM

## 2015-11-05 ENCOUNTER — Ambulatory Visit (INDEPENDENT_AMBULATORY_CARE_PROVIDER_SITE_OTHER): Payer: BLUE CROSS/BLUE SHIELD | Admitting: *Deleted

## 2015-11-05 DIAGNOSIS — I4891 Unspecified atrial fibrillation: Secondary | ICD-10-CM

## 2015-11-05 DIAGNOSIS — Z5181 Encounter for therapeutic drug level monitoring: Secondary | ICD-10-CM | POA: Diagnosis not present

## 2015-11-05 LAB — POCT INR: INR: 3

## 2015-12-03 ENCOUNTER — Ambulatory Visit (INDEPENDENT_AMBULATORY_CARE_PROVIDER_SITE_OTHER): Payer: BLUE CROSS/BLUE SHIELD | Admitting: *Deleted

## 2015-12-03 DIAGNOSIS — I4891 Unspecified atrial fibrillation: Secondary | ICD-10-CM

## 2015-12-03 DIAGNOSIS — Z5181 Encounter for therapeutic drug level monitoring: Secondary | ICD-10-CM

## 2015-12-03 LAB — POCT INR: INR: 3.7

## 2015-12-17 ENCOUNTER — Ambulatory Visit (INDEPENDENT_AMBULATORY_CARE_PROVIDER_SITE_OTHER): Payer: BLUE CROSS/BLUE SHIELD | Admitting: *Deleted

## 2015-12-17 DIAGNOSIS — Z5181 Encounter for therapeutic drug level monitoring: Secondary | ICD-10-CM

## 2015-12-17 DIAGNOSIS — I4891 Unspecified atrial fibrillation: Secondary | ICD-10-CM

## 2015-12-17 LAB — POCT INR: INR: 2

## 2016-01-06 ENCOUNTER — Other Ambulatory Visit: Payer: Self-pay | Admitting: *Deleted

## 2016-01-06 MED ORDER — VERAPAMIL HCL 120 MG PO TABS: 120.0000 mg | ORAL_TABLET | Freq: Two times a day (BID) | ORAL | Status: AC

## 2016-01-06 MED ORDER — METOPROLOL SUCCINATE ER 50 MG PO TB24: 50.0000 mg | ORAL_TABLET | Freq: Every day | ORAL | Status: AC

## 2016-01-06 MED ORDER — WARFARIN SODIUM 5 MG PO TABS: 7.5000 mg | ORAL_TABLET | Freq: Every day | ORAL | Status: AC

## 2016-01-14 ENCOUNTER — Ambulatory Visit (INDEPENDENT_AMBULATORY_CARE_PROVIDER_SITE_OTHER): Payer: BLUE CROSS/BLUE SHIELD | Admitting: *Deleted

## 2016-01-14 DIAGNOSIS — I4891 Unspecified atrial fibrillation: Secondary | ICD-10-CM | POA: Diagnosis not present

## 2016-01-14 DIAGNOSIS — Z5181 Encounter for therapeutic drug level monitoring: Secondary | ICD-10-CM

## 2016-01-14 LAB — POCT INR: INR: 2.8

## 2016-02-03 ENCOUNTER — Other Ambulatory Visit: Payer: Self-pay | Admitting: Cardiovascular Disease

## 2016-02-26 ENCOUNTER — Telehealth: Payer: Self-pay | Admitting: Cardiovascular Disease

## 2016-02-26 NOTE — Telephone Encounter (Signed)
Moved to [place]    Their old cardiologist is requesting that out Dr Koneswaran refer him back to him for care. They would not accept just the records.     °Dr Gary Worcester  telephone [phone] °

## 2016-02-27 NOTE — Telephone Encounter (Signed)
Call placed to Dr. Karna ChristmasGary Worcester office. Stated that if the patient had not been seen in 3 years, they become new patient & will need referral from PMD at that time.   Wife Lupita Leash(Donna) notified and verbalized understanding.

## 2016-03-31 ENCOUNTER — Ambulatory Visit: Payer: BLUE CROSS/BLUE SHIELD | Admitting: Cardiovascular Disease

## 2016-04-10 ENCOUNTER — Other Ambulatory Visit: Payer: Self-pay | Admitting: Neurology

## 2016-05-08 ENCOUNTER — Telehealth: Payer: Self-pay | Admitting: Neurology

## 2016-05-08 NOTE — Telephone Encounter (Signed)
Records faxed as requested with confirmation received.

## 2016-05-08 NOTE — Telephone Encounter (Signed)
Christopher CoupeLarry Blanchard 04/28/1951. His wife Christopher Blanchard [phone] called and said that [place] and they are needing his Medical records faxed to his New Neurologist Dr. Carlisle Beersyler Webb. The fax # is [phone] He is seeing his Neurologist on Tuesday 05/11/16. Thank you.

## 2016-06-29 IMAGING — MR MR HEAD W/O CM
8 series · 35 of 48 positions shown · non-contrast
Comparison: None.

CLINICAL DATA: Progressive supranuclear palsy. Fasciculation of
lower extremity. Weakness.

EXAM:
MRI HEAD WITHOUT CONTRAST
TECHNIQUE: Multiplanar, multiecho pulse sequences of the brain and surrounding
structures were obtained without intravenous contrast.

[Series 2: T1 · sagittal · 5.0mm · 0.45mm/px · 2 of 19 slices shown (1 of 2)]
[im 1/19]
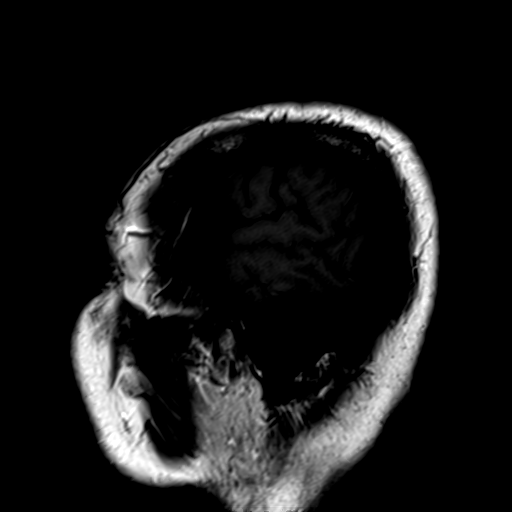
[im 19/19]
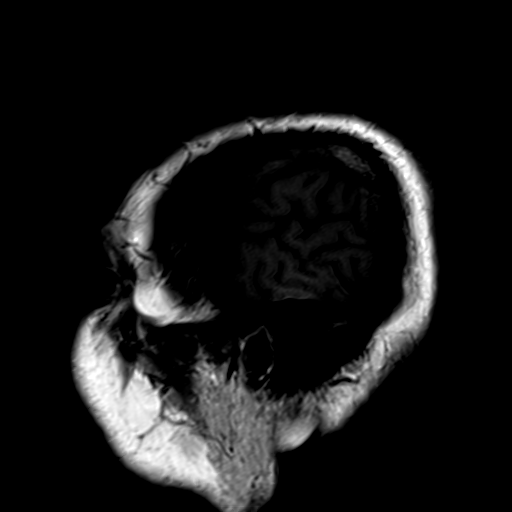

[Series 3: ep2d_diff_(id)_trace · axial · 3.0mm · 1.80mm/px · z∈[-95,+52]mm · 8 of 100 slices shown]
[im 1/100]
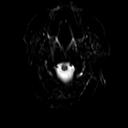
[im 19/100]
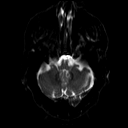
[im 28/100]
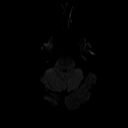
[im 46/100]
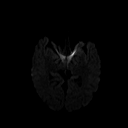
[im 55/100]
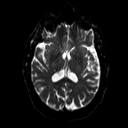
[im 73/100]
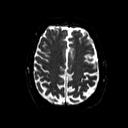
[im 82/100]
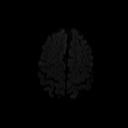
[im 100/100]
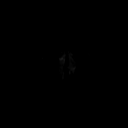

[Series 4: ep2d_diff_(id)_trace_adc · axial · 3.0mm · 1.80mm/px · z∈[-95,+52]mm · 6 of 46 slices shown]
[im 1/46]
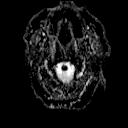
[im 10/46]
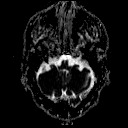
[im 19/46]
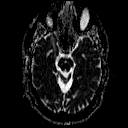
[im 28/46]
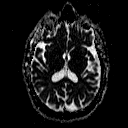
[im 37/46]
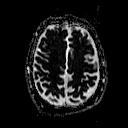
[im 46/46]
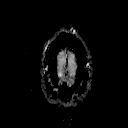

[Series 6: swi_images · axial · 2.0mm · 0.90mm/px · z∈[-100,-84]mm · 2 of 80 slices shown]
[im 1/80]
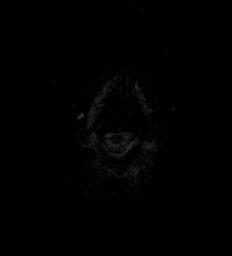
[im 9/80]
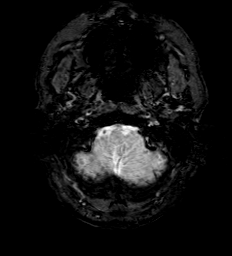

[Series 7: FLAIR · axial · 5.0mm · 0.90mm/px · z∈[-96,+53]mm · 3 of 24 slices shown]
[im 1/24]
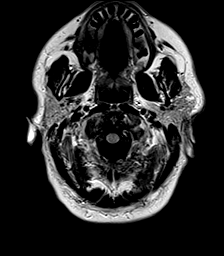
[im 12/24]
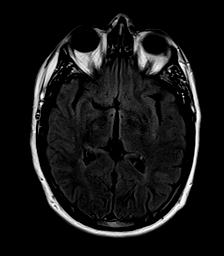
[im 24/24]
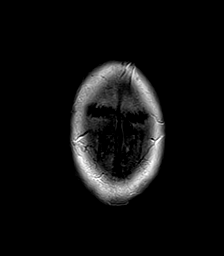

[Series 8: T2 · axial · 5.0mm · 0.30mm/px · z∈[-99,+57]mm · 3 of 25 slices shown (1 of 2)]
[im 1/25]
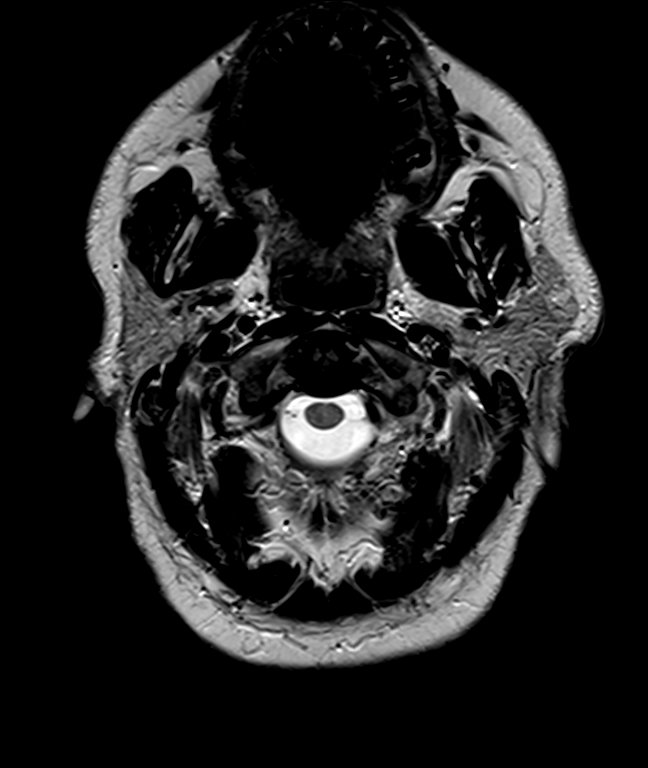
[im 13/25]
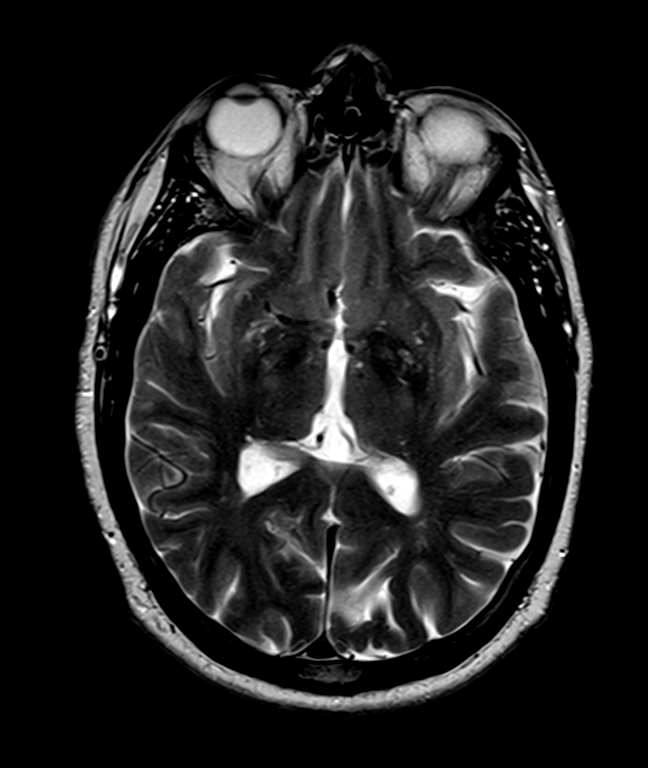
[im 25/25]
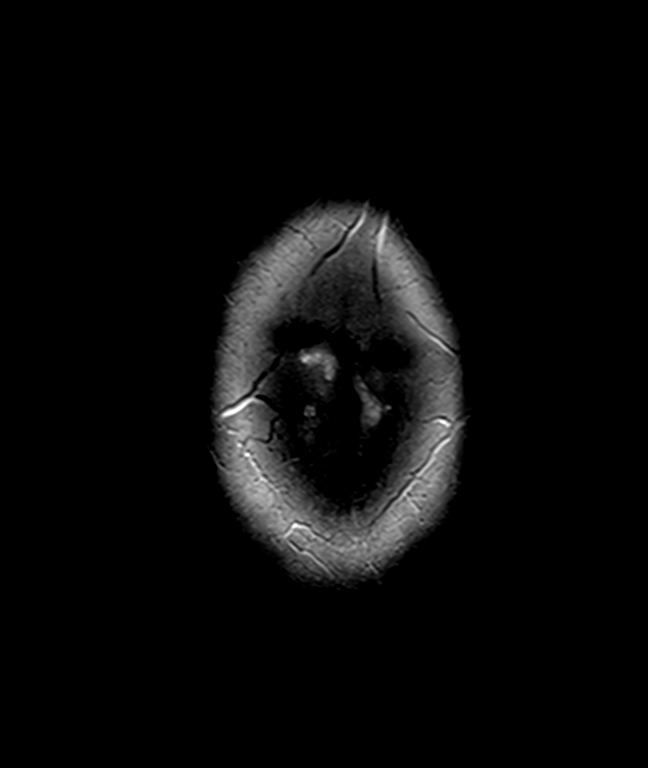

[Series 9: T1 · axial · 2.0mm · 0.45mm/px · z∈[-92,+50]mm · 8 of 72 slices shown (2 of 2)]
[im 1/72]
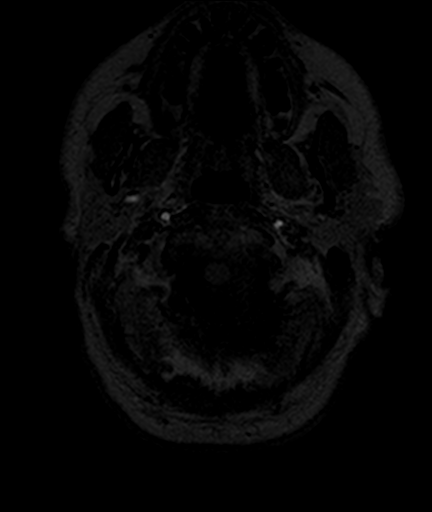
[im 9/72]
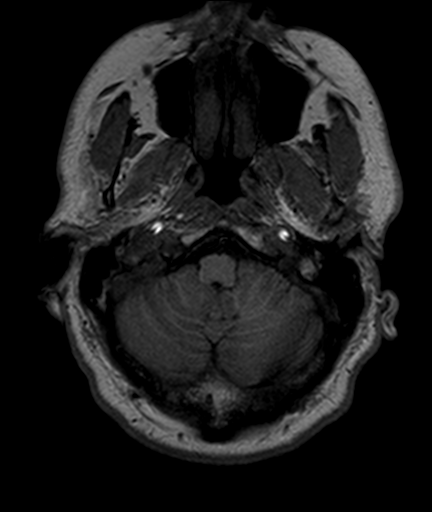
[im 18/72]
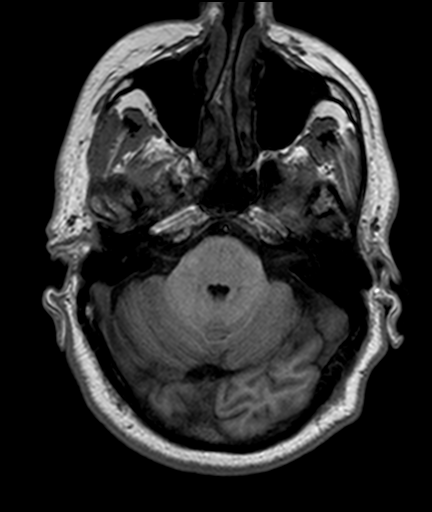
[im 27/72]
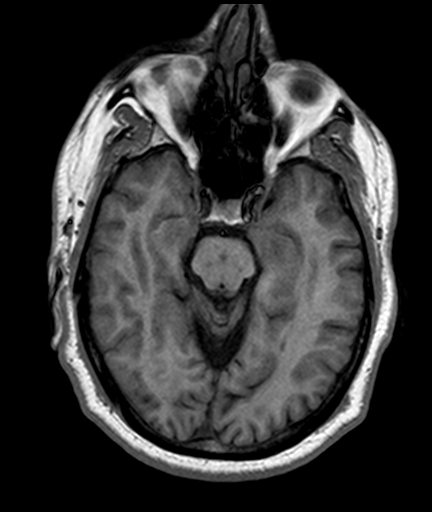
[im 45/72]
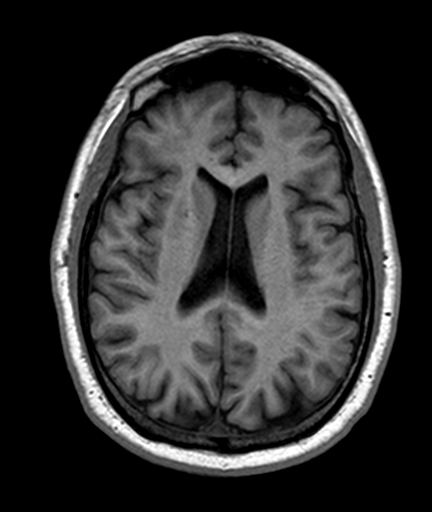
[im 54/72]
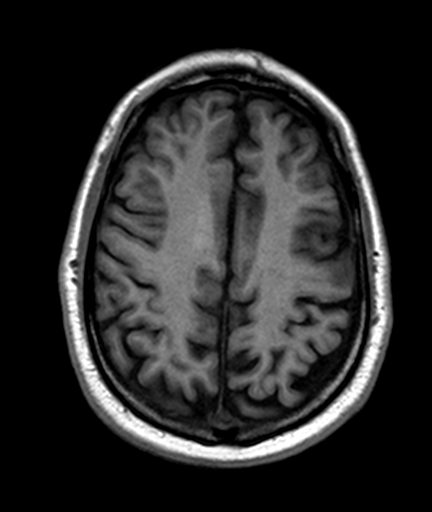
[im 63/72]
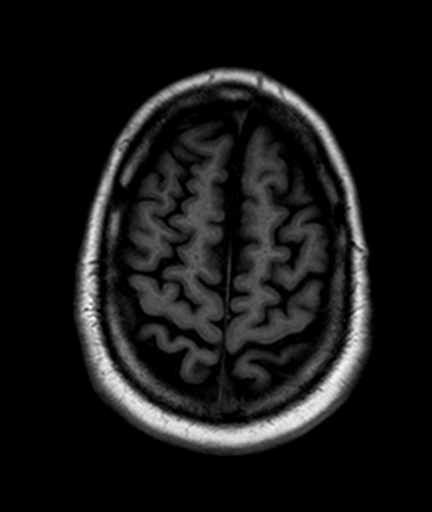
[im 72/72]
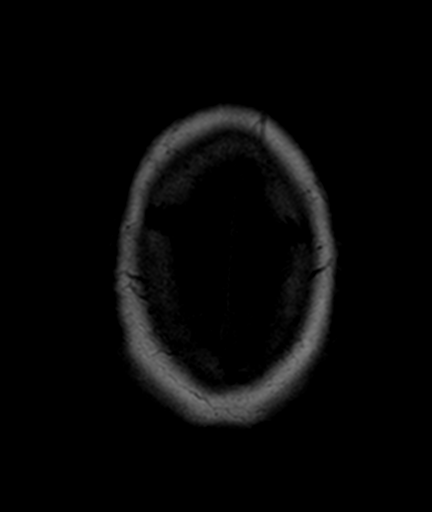

[Series 10: T2 · coronal · 5.0mm · 0.45mm/px · 3 of 22 slices shown (2 of 2)]
[im 1/22]
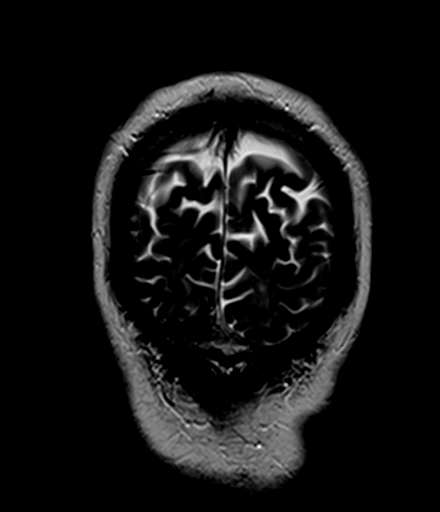
[im 11/22]
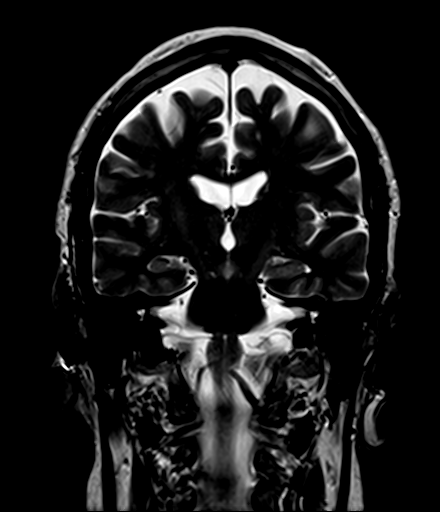
[im 22/22]
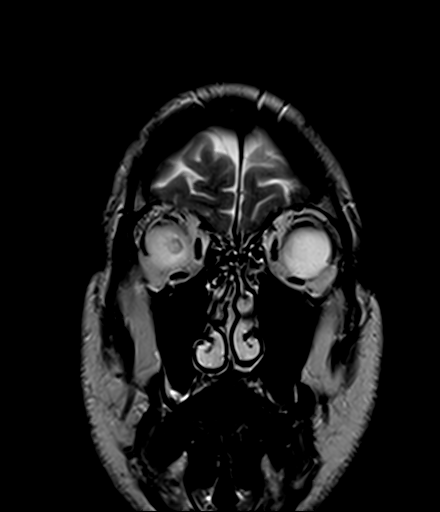

[35 of 48 positions shown; findings below may reference images not displayed]

FINDINGS: There is no evidence of acute infarct, intracranial hemorrhage,
mass, midline shift, or extra-axial fluid collection. Mild cerebral
atrophy is within normal limits for age. No disproportionate
midbrain atrophy is identified. Small foci of T2 hyperintensity are
present in the subcortical and deep cerebral white matter
bilaterally, slightly advanced for age.

Orbits are unremarkable. There is trace mucosal thickening in the
paranasal sinuses. No significant mastoid fluid. Major intracranial
vascular flow voids are preserved.
IMPRESSION: 1. No acute intracranial abnormality or mass.
2. Mild cerebral white matter disease, nonspecific but compatible
with chronic small vessel ischemia.

## 2017-01-14 ENCOUNTER — Ambulatory Visit: Payer: Self-pay | Admitting: *Deleted

## 2017-01-14 DIAGNOSIS — Z5181 Encounter for therapeutic drug level monitoring: Secondary | ICD-10-CM
